# Patient Record
Sex: Male | Born: 1975 | Race: White | Hispanic: No | Marital: Single | Smoking: Current every day smoker
Health system: Southern US, Community
[De-identification: ages and names within clinical notes are randomized; demographics above are authoritative.]

## PROBLEM LIST (undated history)

## (undated) DIAGNOSIS — F32A Depression, unspecified: Secondary | ICD-10-CM

## (undated) DIAGNOSIS — F419 Anxiety disorder, unspecified: Secondary | ICD-10-CM

## (undated) DIAGNOSIS — I10 Essential (primary) hypertension: Secondary | ICD-10-CM

## (undated) DIAGNOSIS — F329 Major depressive disorder, single episode, unspecified: Secondary | ICD-10-CM

---

## 2016-02-13 ENCOUNTER — Emergency Department (HOSPITAL_COMMUNITY)
Admission: EM | Admit: 2016-02-13 | Discharge: 2016-02-13 | Disposition: A | Discharge status: Home / Self Care | Payer: Self-pay | Attending: Emergency Medicine | Admitting: Emergency Medicine

## 2016-02-13 ENCOUNTER — Encounter (HOSPITAL_COMMUNITY): Payer: Self-pay | Admitting: *Deleted

## 2016-02-13 DIAGNOSIS — F17210 Nicotine dependence, cigarettes, uncomplicated: Secondary | ICD-10-CM | POA: Insufficient documentation

## 2016-02-13 DIAGNOSIS — N50819 Testicular pain, unspecified: Secondary | ICD-10-CM | POA: Insufficient documentation

## 2016-02-13 NOTE — ED Notes (Signed)
Pt states that when he woke up Sat morning and went to the bathroom he noted that he had soreness to his scrotum and the area right above his penis; pt states that the pain has progressively gotten worse throughout the day; pt states he feels that the area is swollen; pt states that his scrotum is sore to the touch; pt denies injury; pt denies pain to penis

## 2016-02-13 NOTE — ED Notes (Signed)
No answer when pt's name called in the waiting room; pt eloped from the lobby

## 2016-02-13 NOTE — ED Notes (Signed)
No answer when pt's name called in the lobby 

## 2016-02-13 NOTE — ED Notes (Signed)
No answer when pt's name called in the waiting room 

## 2017-06-09 ENCOUNTER — Inpatient Hospital Stay (HOSPITAL_COMMUNITY)
Admission: EM | Admit: 2017-06-09 | Discharge: 2017-06-11 | DRG: 684 | Disposition: A | Discharge status: Home / Self Care | Payer: Self-pay | Attending: Internal Medicine | Admitting: Internal Medicine

## 2017-06-09 ENCOUNTER — Encounter (HOSPITAL_COMMUNITY): Payer: Self-pay | Admitting: Emergency Medicine

## 2017-06-09 DIAGNOSIS — R42 Dizziness and giddiness: Secondary | ICD-10-CM

## 2017-06-09 DIAGNOSIS — E875 Hyperkalemia: Secondary | ICD-10-CM | POA: Diagnosis present

## 2017-06-09 DIAGNOSIS — F419 Anxiety disorder, unspecified: Secondary | ICD-10-CM

## 2017-06-09 DIAGNOSIS — G4700 Insomnia, unspecified: Secondary | ICD-10-CM | POA: Diagnosis present

## 2017-06-09 DIAGNOSIS — F329 Major depressive disorder, single episode, unspecified: Secondary | ICD-10-CM | POA: Diagnosis present

## 2017-06-09 DIAGNOSIS — I10 Essential (primary) hypertension: Secondary | ICD-10-CM

## 2017-06-09 DIAGNOSIS — E860 Dehydration: Secondary | ICD-10-CM | POA: Diagnosis present

## 2017-06-09 DIAGNOSIS — Z8249 Family history of ischemic heart disease and other diseases of the circulatory system: Secondary | ICD-10-CM

## 2017-06-09 DIAGNOSIS — Z791 Long term (current) use of non-steroidal anti-inflammatories (NSAID): Secondary | ICD-10-CM

## 2017-06-09 DIAGNOSIS — F17210 Nicotine dependence, cigarettes, uncomplicated: Secondary | ICD-10-CM | POA: Diagnosis present

## 2017-06-09 DIAGNOSIS — F32A Depression, unspecified: Secondary | ICD-10-CM

## 2017-06-09 DIAGNOSIS — I959 Hypotension, unspecified: Secondary | ICD-10-CM | POA: Diagnosis present

## 2017-06-09 DIAGNOSIS — N179 Acute kidney failure, unspecified: Principal | ICD-10-CM | POA: Diagnosis present

## 2017-06-09 LAB — CBC
HCT: 45.7 % (ref 39.0–52.0)
HEMOGLOBIN: 16.2 g/dL (ref 13.0–17.0)
MCH: 32 pg (ref 26.0–34.0)
MCHC: 35.4 g/dL (ref 30.0–36.0)
MCV: 90.3 fL (ref 78.0–100.0)
Platelets: 326 10*3/uL (ref 150–400)
RBC: 5.06 MIL/uL (ref 4.22–5.81)
RDW: 13.5 % (ref 11.5–15.5)
WBC: 16.8 10*3/uL — ABNORMAL HIGH (ref 4.0–10.5)

## 2017-06-09 LAB — COMPREHENSIVE METABOLIC PANEL
ALT: 19 U/L (ref 17–63)
ANION GAP: 11 (ref 5–15)
AST: 17 U/L (ref 15–41)
Albumin: 4.8 g/dL (ref 3.5–5.0)
Alkaline Phosphatase: 71 U/L (ref 38–126)
BUN: 35 mg/dL — ABNORMAL HIGH (ref 6–20)
CHLORIDE: 101 mmol/L (ref 101–111)
CO2: 25 mmol/L (ref 22–32)
CREATININE: 3.38 mg/dL — AB (ref 0.61–1.24)
Calcium: 9.7 mg/dL (ref 8.9–10.3)
GFR calc non Af Amer: 21 mL/min — ABNORMAL LOW (ref >60)
GFR, EST AFRICAN AMERICAN: 24 mL/min — AB (ref >60)
Glucose, Bld: 112 mg/dL — ABNORMAL HIGH (ref 65–99)
POTASSIUM: 4.5 mmol/L (ref 3.5–5.1)
SODIUM: 137 mmol/L (ref 135–145)
Total Bilirubin: 1.1 mg/dL (ref 0.3–1.2)
Total Protein: 8.3 g/dL — ABNORMAL HIGH (ref 6.5–8.1)

## 2017-06-09 LAB — I-STAT TROPONIN, ED: TROPONIN I, POC: 0 ng/mL (ref 0.00–0.08)

## 2017-06-09 LAB — URINALYSIS, ROUTINE W REFLEX MICROSCOPIC
GLUCOSE, UA: NEGATIVE mg/dL
Hgb urine dipstick: NEGATIVE
KETONES UR: 5 mg/dL — AB
NITRITE: NEGATIVE
Specific Gravity, Urine: 1.02 (ref 1.005–1.030)
pH: 5 (ref 5.0–8.0)

## 2017-06-09 LAB — RAPID URINE DRUG SCREEN, HOSP PERFORMED
Amphetamines: POSITIVE — AB
BENZODIAZEPINES: POSITIVE — AB
Barbiturates: NOT DETECTED
Cocaine: NOT DETECTED
Opiates: NOT DETECTED
Tetrahydrocannabinol: POSITIVE — AB

## 2017-06-09 LAB — I-STAT CG4 LACTIC ACID, ED: LACTIC ACID, VENOUS: 0.92 mmol/L (ref 0.5–1.9)

## 2017-06-09 LAB — TROPONIN I: Troponin I: <0.03 ng/mL (ref <0.03)

## 2017-06-09 LAB — ETHANOL

## 2017-06-09 MED ORDER — ACETAMINOPHEN 650 MG RE SUPP: 650.0000 mg | Freq: Four times a day (QID) | RECTAL | Status: DC | PRN

## 2017-06-09 MED ORDER — ONDANSETRON HCL 4 MG PO TABS: 4.0000 mg | ORAL_TABLET | Freq: Four times a day (QID) | ORAL | Status: DC | PRN

## 2017-06-09 MED ORDER — ALPRAZOLAM 0.5 MG PO TABS
0.5000 mg | ORAL_TABLET | Freq: Every evening | ORAL | Status: DC | PRN
Start: 2017-06-09 — End: 2017-06-11
  Administered 2017-06-09 – 2017-06-10 (×2): 0.5 mg via ORAL
  Filled 2017-06-09 (×2): qty 1

## 2017-06-09 MED ORDER — HEPARIN SODIUM (PORCINE) 5000 UNIT/ML IJ SOLN
5000.0000 [IU] | Freq: Three times a day (TID) | INTRAMUSCULAR | Status: DC
  Administered 2017-06-09 – 2017-06-11 (×5): 5000 [IU] via SUBCUTANEOUS
  Filled 2017-06-09 (×5): qty 1

## 2017-06-09 MED ORDER — SODIUM CHLORIDE 0.9 % IV SOLN: 1000.0000 mL | INTRAVENOUS | Status: DC

## 2017-06-09 MED ORDER — TRAMADOL HCL 50 MG PO TABS
50.0000 mg | ORAL_TABLET | Freq: Two times a day (BID) | ORAL | Status: DC | PRN
  Administered 2017-06-09: 50 mg via ORAL
  Filled 2017-06-09: qty 1

## 2017-06-09 MED ORDER — ENSURE ENLIVE PO LIQD
237.0000 mL | Freq: Two times a day (BID) | ORAL | Status: DC
  Administered 2017-06-10 – 2017-06-11 (×3): 237 mL via ORAL

## 2017-06-09 MED ORDER — SODIUM CHLORIDE 0.9 % IV SOLN
INTRAVENOUS | Status: DC
  Administered 2017-06-09 – 2017-06-11 (×3): via INTRAVENOUS

## 2017-06-09 MED ORDER — SODIUM CHLORIDE 0.9 % IV BOLUS (SEPSIS)
2000.0000 mL | Freq: Once | INTRAVENOUS | Status: AC
  Administered 2017-06-09: 2000 mL via INTRAVENOUS

## 2017-06-09 MED ORDER — ZOLPIDEM TARTRATE 5 MG PO TABS
5.0000 mg | ORAL_TABLET | Freq: Every evening | ORAL | Status: DC | PRN
  Administered 2017-06-10: 5 mg via ORAL
  Filled 2017-06-09: qty 1

## 2017-06-09 MED ORDER — ONDANSETRON HCL 4 MG/2ML IJ SOLN: 4.0000 mg | Freq: Four times a day (QID) | INTRAMUSCULAR | Status: DC | PRN

## 2017-06-09 MED ORDER — SODIUM CHLORIDE 0.9 % IV BOLUS (SEPSIS): 1000.0000 mL | Freq: Once | INTRAVENOUS | Status: DC

## 2017-06-09 MED ORDER — ACETAMINOPHEN 325 MG PO TABS
650.0000 mg | ORAL_TABLET | Freq: Four times a day (QID) | ORAL | Status: DC | PRN
  Administered 2017-06-10: 650 mg via ORAL
  Filled 2017-06-09: qty 2

## 2017-06-09 NOTE — ED Notes (Signed)
Call report to Dois DavenportSandra at [phone] at [facility]

## 2017-06-09 NOTE — H&P (Addendum)
TRH H&P    Patient Demographics:    Luis CossRobert Raffety, is a 41 y.o. male  MRN: [id number]  DOB - 02/14/1976  Admit Date - 06/09/2017  Referring MD/NP/PA: Arlana HoveSophiya  Outpatient Primary MD for the patient is Dartha LodgeSteele, Anthony, FNP  Patient coming from: Home  Chief Complaint  Patient presents with  . Near Syncope  . Dizziness      HPI:    Luis Cortez  is a 41 y.o. male, with history of hypertension, anxiety came to hospital with complaints of dizziness. Patient says that symptoms started 10 days ago when he received bad news that he will need to go to jail for 14 years. Since then patient says that he has not been able to sleep well, has not been eating much food. He has been taking his medications for hypertension including lisinopril. 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 Patient says that every time he got up from sitting position he felt dizzy but he denies passing out. He denies chest pain or shortness of breath. No nausea vomiting or diarrhea. Patient also was prescribed Prozac which made his anxiety worse. He stopped taking Prozac a week ago.  He denies fever or dysuria. No abdominal pain.  In the ED, lab work showed creatinine 3.38. With BUN 35. Unknown baseline.    Review of systems:     All other systems reviewed and are negative.   With Past History of the following :    Hypertension   Social History:      Social History  Substance Use Topics  . Smoking status: Current Every Day Smoker    Packs/day: 1.00    Types: Cigarettes  . Smokeless tobacco: Never Used  . Alcohol use Yes     Comment: socially       Family History :   Positive family history of hypertension, heart disease patient's both grandparents from both mother and father's side had heart problems, hypertension   Home Medications:   Prior to Admission medications   Medication Sig Start  Date End Date Taking? Authorizing Provider  FLUoxetine (PROZAC) 40 MG capsule Take 40 mg by mouth daily.   Yes [provider]  hydrOXYzine (ATARAX/VISTARIL) 25 MG tablet Take 25 mg by mouth 3 (three) times daily as needed. Take 1 capsule in the morning and 2 in the evening   Yes [provider]  lamoTRIgine (LAMICTAL) 100 MG tablet Take 100 mg by mouth 2 (two) times daily.   Yes [provider]  lisinopril (PRINIVIL,ZESTRIL) 20 MG tablet Take 20 mg by mouth daily.   Yes [provider]  meloxicam (MOBIC) 15 MG tablet Take 15 mg by mouth daily.   Yes [provider]     Allergies:    No Known Allergies   Physical Exam:   Vitals  Blood pressure 98/61, pulse 97, temperature 98 F (36.7 C), temperature source Oral, resp. rate 18, SpO2 100 %.  1.  General: Appears anxious  2. Psychiatric:  Intact  judgement and  insight, awake alert, oriented x 3.  3. Neurologic: No focal neurological deficits, all cranial nerves intact.Strength 5/5 all 4 extremities, sensation intact all 4 extremities, plantars down going.  4. Eyes :  anicteric sclerae, moist conjunctivae with no lid lag. PERRLA.  5. ENMT:  Oropharynx clear with moist mucous membranes and good dentition  6. Neck:  supple, no cervical lymphadenopathy appriciated, No thyromegaly  7. Respiratory : Normal respiratory effort, good air movement bilaterally,clear to  auscultation bilaterally  8. Cardiovascular : RRR, no gallops, rubs or murmurs, no leg edema  9. Gastrointestinal:  Positive bowel sounds, abdomen soft, non-tender to palpation,no hepatosplenomegaly, no rigidity or guarding       10. Skin:  No cyanosis, normal texture and turgor, no rash, lesions or ulcers  11.Musculoskeletal:  Good muscle tone,  joints appear normal , no effusions,  normal range of motion    Data Review:    CBC  Recent Labs Lab 06/09/17 1554  WBC 16.8*  HGB 16.2  HCT 45.7  PLT 326  MCV  90.3  MCH 32.0  MCHC 35.4  RDW 13.5   ------------------------------------------------------------------------------------------------------------------  Chemistries   Recent Labs Lab 06/09/17 1554  NA 137  K 4.5  CL 101  CO2 25  GLUCOSE 112*  BUN 35*  CREATININE 3.38*  CALCIUM 9.7  AST 17  ALT 19  ALKPHOS 71  BILITOT 1.1   ------------------------------------------------------------------------------------------------------------------  ------------------------------------------------------------------------------------------------------------------ GFR: CrCl cannot be calculated (Unknown ideal weight.). Liver Function Tests:  Recent Labs Lab 06/09/17 1554  AST 17  ALT 19  ALKPHOS 71  BILITOT 1.1  PROT 8.3*  ALBUMIN 4.8    --------------------------------------------------------------------------------------------------------------- Urine analysis:    Component Value Date/Time   COLORURINE AMBER (A) 06/09/2017 1515   APPEARANCEUR CLOUDY (A) 06/09/2017 1515   LABSPEC 1.020 06/09/2017 1515   PHURINE 5.0 06/09/2017 1515   GLUCOSEU NEGATIVE 06/09/2017 1515   HGBUR NEGATIVE 06/09/2017 1515   BILIRUBINUR SMALL (A) 06/09/2017 1515   KETONESUR 5 (A) 06/09/2017 1515   PROTEINUR >=300 (A) 06/09/2017 1515   NITRITE NEGATIVE 06/09/2017 1515   LEUKOCYTESUR SMALL (A) 06/09/2017 1515      Imaging Results:    No results found.  EKG- nonspecific ST changes   Assessment & Plan:    Active Problems:   AKI (acute kidney injury) (HCC)   1. Acute kidney injury- unknown baseline, creatinine is 3.38 will check urine creatinine, urine sodium, renal ultrasound. Started on IV normal saline at 125 ML per hour. Follow BMP in a.m. 2. Dizziness/presyncope-likely due to above. Started on IV normal saline. Follow BMP in a.m. Will obtain Echocardiogram. 3. Hypertension- blood pressure is soft, will hold lisinopril due to above. Will monitor. 4. Anxiety-patient has been  having anxiety and insomnia for past 1 week, start Xanax 0.5 mg daily at bedtime when necessary. Will hold Prozac as patient has not continued this medication as outpatient. Hold Lamictal. 5. Polysubstance abuse- urine drug screen is positive for benzodiazepine, amphetamines, tetrahydrocannabinol. Will get social work consult. 6. Abnormal EKG-EKG shows nonspecific ST changes, also patient came with presyncope. Will obtain echocardiogram in a.m., cycle cardiac enzymes every 6 hours 3.   DVT Prophylaxis-   Heparin   AM Labs Ordered, also please review Full Orders  Family Communication: Admission, patients condition and plan of care including tests being ordered have been discussed with the patient and *his wife at bedside who indicate understanding and agree with the plan and Code Status.  Code Status:  Full code  Admission status: Observation    Time spent in minutes : 50 minutes   Fermon Ureta S M.D on 06/09/2017 at 5:29 PM  Between 7am to 7pm - Pager - [phone]. After 7pm go to [email or web address] - password [facility]  [facility] - Office  [phone]

## 2017-06-09 NOTE — ED Provider Notes (Signed)
[facility]-EMERGENCY DEPT Provider Note   CSN: [id number] Arrival date & time: 06/09/17  1335     History   Chief Complaint Chief Complaint  Patient presents with  . Near Syncope  . Dizziness    HPI Luis Cortez is a 41 y.o. male presenting with near syncope.  Patient states that 10 days ago, he received bad news that he would need to show up for court and go to jail for 14 years. Since then, he's been unable to eat, drink, or sleep. He works as a Scientist, water quality outside. Yesterday, he started to have orthostatic symptoms including dizziness when he went from sitting to his standing. This morning, his symptoms were more pronounced. He still tried to go to work, but was unable to tolerate work due to his symptoms. He reports that he is asymptomatic when he is lying down and at rest, but when he becomes anxious or tries to stand up, he gets lightheaded and very shaky. He has associated headache of bilateral temples.  He took his normal medicines this morning, including lisinopril. He is also on Atarax which he takes every day. He is prescribed Prozac, but has not taken it for the past 4 days, as he states it made him feel worse. Patient smokes half a pack a day, drinks 3-4 beers 3 times a week, and states that he did just a little bit of meth 2 days ago. He denies fever, chills, cough, sore throat, chest pain, shortness of breath, nausea, vomiting, abdominal pain, or abnormal bowel movements. He reports decreased urination. He denies vision changes, decreased concentration, slurred speech, numbness, tingling, or weakness.   HPI  History reviewed. No pertinent past medical history.  There are no active problems to display for this patient.   History reviewed. No pertinent surgical history.     Home Medications    Prior to Admission medications   Medication Sig Start Date End Date Taking? Authorizing Provider  FLUoxetine (PROZAC) 40 MG capsule Take 40 mg by mouth daily.   Yes [provider]  hydrOXYzine (ATARAX/VISTARIL) 25 MG tablet Take 25 mg by mouth 3 (three) times daily as needed. Take 1 capsule in the morning and 2 in the evening   Yes [provider]  lamoTRIgine (LAMICTAL) 100 MG tablet Take 100 mg by mouth 2 (two) times daily.   Yes [provider]  lisinopril (PRINIVIL,ZESTRIL) 20 MG tablet Take 20 mg by mouth daily.   Yes [provider]  meloxicam (MOBIC) 15 MG tablet Take 15 mg by mouth daily.   Yes [provider]    Family History No family history on file.  Social History Social History  Substance Use Topics  . Smoking status: Current Every Day Smoker    Packs/day: 1.00    Types: Cigarettes  . Smokeless tobacco: Never Used  . Alcohol use Yes     Comment: socially     Allergies   Patient has no known allergies.   Review of Systems Review of Systems  Constitutional: Negative for chills and fever.  HENT: Negative for congestion and sore throat.   Eyes: Negative for photophobia and visual disturbance.  Respiratory: Negative for cough, chest tightness and shortness of breath.   Cardiovascular: Negative for chest pain, palpitations and leg swelling.  Gastrointestinal: Negative for abdominal pain, constipation, diarrhea, nausea and vomiting.  Genitourinary: Positive for decreased urine volume. Negative for dysuria and hematuria.  Musculoskeletal: Negative for back pain and neck pain.  Skin: Negative for  wound.  Neurological: Positive for dizziness, light-headedness and headaches. Negative for weakness.  Hematological: Does not bruise/bleed easily.  Psychiatric/Behavioral: Negative for confusion. The patient is nervous/anxious.      Physical Exam Updated Vital Signs BP 98/61 (BP Location: Left Arm)   Pulse 97   Temp 98 F (36.7 C) (Oral)   Resp 18   SpO2 100%   Physical Exam  Constitutional: He is oriented to person, place, and time. He appears well-developed and well-nourished. No distress.    HENT:  Head: Normocephalic and atraumatic.  Mouth/Throat: Uvula is midline. Mucous membranes are dry.  Eyes: Pupils are equal, round, and reactive to light. EOM are normal.  Neck: Normal range of motion. Neck supple.  Cardiovascular: Regular rhythm and intact distal pulses.  Tachycardia present.   Pulmonary/Chest: Effort normal and breath sounds normal. No respiratory distress. He has no wheezes. He exhibits no tenderness.  Abdominal: Soft. Bowel sounds are normal. He exhibits no distension and no mass. There is no tenderness. There is no rebound and no guarding.  Musculoskeletal: Normal range of motion.  Strength intact 4. Sensation intact 4. Radial and pedal pulses equal bilaterally. Color and warmth equal bilaterally.  Neurological: He is alert and oriented to person, place, and time. He has normal strength. No cranial nerve deficit or sensory deficit. He displays a negative Romberg sign. Coordination normal. GCS eye subscore is 4. GCS verbal subscore is 5. GCS motor subscore is 6.  Skin: Skin is warm and dry.  Psychiatric: He has a normal mood and affect.  Nursing note and vitals reviewed.    ED Treatments / Results  Labs (all labs ordered are listed, but only abnormal results are displayed) Labs Reviewed  CBC - Abnormal; Notable for the following:       Result Value   WBC 16.8 (*)    All other components within normal limits  COMPREHENSIVE METABOLIC PANEL - Abnormal; Notable for the following:    Glucose, Bld 112 (*)    BUN 35 (*)    Creatinine, Ser 3.38 (*)    Total Protein 8.3 (*)    GFR calc non Af Amer 21 (*)    GFR calc Af Amer 24 (*)    All other components within normal limits  RAPID URINE DRUG SCREEN, HOSP PERFORMED - Abnormal; Notable for the following:    Benzodiazepines POSITIVE (*)    Amphetamines POSITIVE (*)    Tetrahydrocannabinol POSITIVE (*)    All other components within normal limits  ETHANOL  URINALYSIS, ROUTINE W REFLEX MICROSCOPIC  I-STAT  TROPONIN, ED  I-STAT CG4 LACTIC ACID, ED    EKG  EKG Interpretation  Date/Time:  Saturday June 09 2017 13:58:52 EDT Ventricular Rate:  104 PR Interval:    QRS Duration: 96 QT Interval:  354 QTC Calculation: 466 R Axis:   94 Text Interpretation:  Sinus tachycardia Consider right atrial enlargement Borderline right axis deviation Probable left ventricular hypertrophy ST elevation suggests acute pericarditis No previous ECGs available Confirmed by Richardean Canal [phone]) on 06/09/2017 3:58:07 PM       Radiology No results found.  Procedures Procedures (including critical care time)  Medications Ordered in ED Medications  sodium chloride 0.9 % bolus 2,000 mL (2,000 mLs Intravenous New Bag/Given 06/09/17 1600)     Initial Impression / Assessment and Plan / ED Course  I have reviewed the triage vital signs and the nursing notes.  Pertinent labs & imaging results that were available during my care of the  patient were reviewed by me and considered in my medical decision making (see chart for details).     Patient presenting with hypotension, dizziness, and lightheadedness. Symptoms are present when he stands up. Vital signs show patient is hypotensive and tachycardic, positive orthostatic vital signs. Physical exam otherwise reassuring, no neurologic deficits. Will order basic labs, UA, UDS, EKG, troponin, and ethanol.  CMP shows elevated creatinine at 3.38. No baseline to compare. UDS positive for benzos, amphetamines, and marijuana. UA shows proteinuria, small leuks and many bacteria. Will send for culture. Discussed case with attending, and Dr. Silverio LayYao evaluated the pt.  Hypotension is likely multifactorial, including dehydration, lisinopril use, and drug use.  Will consult with hospitalist for admission. Pt to be admitted.  Final Clinical Impressions(s) / ED Diagnoses   Final diagnoses:  Hypotension, unspecified hypotension type  AKI (acute kidney injury) [facility](HCC)    New  Prescriptions New Prescriptions   No medications on file     Alveria ApleyCaccavale, Donnica Jarnagin, PA-C 06/09/17 2341    Charlynne PanderYao, David Hsienta, MD 06/10/17 [phone]

## 2017-06-09 NOTE — ED Notes (Signed)
Bed: WA20 Expected date:  Expected time:  Means of arrival:  Comments: Orthostatic

## 2017-06-09 NOTE — ED Triage Notes (Signed)
Patient here from home with complaints of dizziness when standing. Near syncopal episode today. 400 ml NS given.

## 2017-06-10 ENCOUNTER — Observation Stay (HOSPITAL_BASED_OUTPATIENT_CLINIC_OR_DEPARTMENT_OTHER): Payer: Self-pay

## 2017-06-10 ENCOUNTER — Observation Stay (HOSPITAL_COMMUNITY): Payer: Self-pay

## 2017-06-10 DIAGNOSIS — R55 Syncope and collapse: Secondary | ICD-10-CM

## 2017-06-10 LAB — BASIC METABOLIC PANEL
ANION GAP: 7 (ref 5–15)
BUN: 21 mg/dL — ABNORMAL HIGH (ref 6–20)
CO2: 29 mmol/L (ref 22–32)
Calcium: 9.2 mg/dL (ref 8.9–10.3)
Chloride: 108 mmol/L (ref 101–111)
Creatinine, Ser: 1.1 mg/dL (ref 0.61–1.24)
Glucose, Bld: 91 mg/dL (ref 65–99)
POTASSIUM: 4.7 mmol/L (ref 3.5–5.1)
SODIUM: 144 mmol/L (ref 135–145)

## 2017-06-10 LAB — ECHOCARDIOGRAM COMPLETE
Height: 67 in
Weight: 1862.45 oz

## 2017-06-10 LAB — TROPONIN I: Troponin I: <0.03 ng/mL (ref <0.03)

## 2017-06-10 LAB — COMPREHENSIVE METABOLIC PANEL
ALT: 16 U/L — AB (ref 17–63)
AST: 18 U/L (ref 15–41)
Albumin: 3.9 g/dL (ref 3.5–5.0)
Alkaline Phosphatase: 58 U/L (ref 38–126)
Anion gap: 5 (ref 5–15)
BUN: 28 mg/dL — AB (ref 6–20)
CHLORIDE: 107 mmol/L (ref 101–111)
CO2: 30 mmol/L (ref 22–32)
CREATININE: 1.71 mg/dL — AB (ref 0.61–1.24)
Calcium: 8.9 mg/dL (ref 8.9–10.3)
GFR calc Af Amer: 56 mL/min — ABNORMAL LOW (ref >60)
GFR, EST NON AFRICAN AMERICAN: 48 mL/min — AB (ref >60)
Glucose, Bld: 75 mg/dL (ref 65–99)
Potassium: 5.2 mmol/L — ABNORMAL HIGH (ref 3.5–5.1)
Sodium: 142 mmol/L (ref 135–145)
Total Bilirubin: 0.9 mg/dL (ref 0.3–1.2)
Total Protein: 6.5 g/dL (ref 6.5–8.1)

## 2017-06-10 LAB — CBC
HCT: 37.5 % — ABNORMAL LOW (ref 39.0–52.0)
Hemoglobin: 12.9 g/dL — ABNORMAL LOW (ref 13.0–17.0)
MCH: 31.4 pg (ref 26.0–34.0)
MCHC: 34.4 g/dL (ref 30.0–36.0)
MCV: 91.2 fL (ref 78.0–100.0)
PLATELETS: 274 10*3/uL (ref 150–400)
RBC: 4.11 MIL/uL — ABNORMAL LOW (ref 4.22–5.81)
RDW: 13.4 % (ref 11.5–15.5)
WBC: 9.8 10*3/uL (ref 4.0–10.5)

## 2017-06-10 LAB — CREATININE, URINE, RANDOM: Creatinine, Urine: 138.9 mg/dL

## 2017-06-10 LAB — SODIUM, URINE, RANDOM: Sodium, Ur: 101 mmol/L

## 2017-06-10 LAB — HIV ANTIBODY (ROUTINE TESTING W REFLEX): HIV SCREEN 4TH GENERATION: NONREACTIVE

## 2017-06-10 MED ORDER — TRAMADOL HCL 50 MG PO TABS
50.0000 mg | ORAL_TABLET | Freq: Four times a day (QID) | ORAL | Status: DC | PRN
  Administered 2017-06-10 – 2017-06-11 (×4): 100 mg via ORAL
  Filled 2017-06-10 (×4): qty 2

## 2017-06-10 NOTE — Progress Notes (Signed)
  Echocardiogram 2D Echocardiogram has been performed.  Saleena Tamas L Androw 06/10/2017, 9:47 AM

## 2017-06-10 NOTE — Progress Notes (Signed)
[facility]                                                                              Patient Demographics  Luis Cortez, is a 41 y.o. male, DOB - 05/28/1976, ZOX:[id number]  Admit date - 06/09/2017   Admitting Physician Meredeth Ide, MD  Outpatient Primary MD for the patient is Dartha Lodge, FNP  Outpatient specialists:   LOS - 0  days    Chief Complaint  Patient presents with  . Near Syncope  . Dizziness       Brief summary  Luis Cortez  is a 41 y.o. male, with history significant for but not limited to hypertension presenting with dizziness associated with poor oral intake after hearing bad news for going to jail.  Ed labs indicated elevated Cr of 3.38 with BUN of 35, and is admitted for AKI likley due to volume depletion   Assessment & Plan    Active Problems:   AKI (acute kidney injury) (HCC)  AKI: renal fxn improving with hydration/supportive care Renal US 06/10/17- Possible cortical calcification or nonobstructive calculus seen in upper pole of right kidney, otherwise negative  Dizziness: Resolved Poss presyncope- echo 06/10/17 - pending Resolved- likely related to AKI  Hypertension: Lisinopril on hold due to AKI- may resume on d/c as needed  Hyperkalemia: Mild, expect to resolve with improvement of renal function  Code Status: Full code DVT Prophylaxis: heparin  Family Communication: Discussed in detail with the patient, all imaging results, lab results explained to the patient   Disposition Plan: Home  Time Spent in minutes   35 minutes  Procedures:    Consultants:     Antimicrobials:      Medications  Scheduled Meds: . feeding supplement (ENSURE ENLIVE)  237 mL Oral BID BM  . heparin  5,000 Units Subcutaneous Q8H   Continuous Infusions: . sodium chloride 125 mL/hr at 06/09/17 2026   PRN Meds:.acetaminophen **OR** acetaminophen, ALPRAZolam, ondansetron **OR** ondansetron (ZOFRAN) IV, traMADol,  zolpidem   Antibiotics   Anti-infectives    None        Subjective:   Luis Cortez was seen and examined today. Patient denies dizziness, chest pain, shortness of breath, abdominal pain, N/V/D/C, new weakness, numbess, tingling. No acute events overnight.    Objective:   Vitals:   06/09/17 1817 06/09/17 1850 06/10/17 0442 06/10/17 0500  BP: 96/70 104/67 (!) 145/94   Pulse: 81 94 (!) 114   Resp: Temp:  98.1 F (36.7 C) 98.1 F (36.7 C)   TempSrc:  Oral Oral   SpO2: 99% 100% (!) 80% 100%  Weight:  52.8 kg (116 lb 6.5 oz)    Height:   (1.702 m)      Intake/Output Summary (Last 24 hours) at 06/10/17 1142 Last data filed at 06/10/17 0443  Gross per 24 hour  Intake           815.83 ml  Output              900 ml  Net           -84.17 ml  Wt Readings from Last 3 Encounters:  06/09/17 52.8 kg (116 lb 6.5 oz)     Exam  General: NAD  HEENT: NCAT,  PERRL,MMM  Neck: SUPPLE, (-) JVD  Cardiovascular: RRR, (-) GALLOP, (-) MURMUR  Respiratory: CTA  Gastrointestinal: SOFT, (-) DISTENSION, BS(+), (_) TENDERNESS  Ext: (-) CYANOSIS, (-) EDEMA  Neuro: A, OX 3  Skin:(-) RASH  Psych:NORMAL AFFECT/MOOD   Data Reviewed:  I have personally reviewed following labs and imaging studies  Micro Results No results found for this or any previous visit (from the past 240 hour(s)).  Radiology Reports [facility]  Result Date: 06/10/2017 CLINICAL DATA:  Acute kidney injury. EXAM: RENAL / URINARY TRACT ULTRASOUND COMPLETE COMPARISON:  None. FINDINGS: Right Kidney: Length: 9.9 cm. 4 mm calcification is noted in upper pole concerning for cortical calcification or nonobstructive nephrolithiasis. Echogenicity within normal limits. No mass or hydronephrosis visualized. Left Kidney: Length: 10.2 cm. Echogenicity within normal limits. No mass or hydronephrosis visualized. Bladder: Appears normal for degree of bladder distention. IMPRESSION: Possible cortical  calcification or nonobstructive calculus seen in upper pole of right kidney. No other renal abnormality seen. Electronically Signed   By: Lupita RaiderJames  Green Jr, M.D.   On: 06/10/2017 09:09    Lab Data:  CBC:  Recent Labs Lab 06/09/17 1554 06/10/17 0137  WBC 16.8* 9.8  HGB 16.2 12.9*  HCT 45.7 37.5*  MCV 90.3 91.2  PLT 326 274   Basic Metabolic Panel:  Recent Labs Lab 06/09/17 1554 06/10/17 0137  NA 137 142  K 4.5 5.2*  CL 101 107  CO2 25 30  GLUCOSE 112* 75  BUN 35* 28*  CREATININE 3.38* 1.71*  CALCIUM 9.7 8.9   GFR: Estimated Creatinine Clearance: 42.5 mL/min (A) (by C-G formula based on SCr of 1.71 mg/dL (H)). Liver Function Tests:  Recent Labs Lab 06/09/17 1554 06/10/17 0137  AST 17 18  ALT 19 16*  ALKPHOS 71 58  BILITOT 1.1 0.9  PROT 8.3* 6.5  ALBUMIN 4.8 3.9   No results for input(s): LIPASE, AMYLASE in the last 168 hours. No results for input(s): AMMONIA in the last 168 hours. Coagulation Profile: No results for input(s): INR, PROTIME in the last 168 hours. Cardiac Enzymes:  Recent Labs Lab 06/09/17 1957 06/10/17 0137 06/10/17 0709  TROPONINI <0.03 <0.03 <0.03   BNP (last 3 results) No results for input(s): PROBNP in the last 8760 hours. HbA1C: No results for input(s): HGBA1C in the last 72 hours. CBG: No results for input(s): GLUCAP in the last 168 hours. Lipid Profile: No results for input(s): CHOL, HDL, LDLCALC, TRIG, CHOLHDL, LDLDIRECT in the last 72 hours. Thyroid Function Tests: No results for input(s): TSH, T4TOTAL, FREET4, T3FREE, THYROIDAB in the last 72 hours. Anemia Panel: No results for input(s): VITAMINB12, FOLATE, FERRITIN, TIBC, IRON, RETICCTPCT in the last 72 hours. Urine analysis:    Component Value Date/Time   COLORURINE AMBER (A) 06/09/2017 1515   APPEARANCEUR CLOUDY (A) 06/09/2017 1515   LABSPEC 1.020 06/09/2017 1515   PHURINE 5.0 06/09/2017 1515   GLUCOSEU NEGATIVE 06/09/2017 1515   HGBUR NEGATIVE 06/09/2017 1515    BILIRUBINUR SMALL (A) 06/09/2017 1515   KETONESUR 5 (A) 06/09/2017 1515   PROTEINUR >=300 (A) 06/09/2017 1515   NITRITE NEGATIVE 06/09/2017 1515   LEUKOCYTESUR SMALL (A) 06/09/2017 1515     OSEI-BONSU,Selig Wampole M.D. [facility] 06/10/2017, 11:42 AM  Pager: [phone] Between 7am to 7pm - call Pager - [phone]  After 7pm go to [email or web address] - password [email or web address]  Call night coverage person covering after 7pm

## 2017-06-11 DIAGNOSIS — I10 Essential (primary) hypertension: Secondary | ICD-10-CM

## 2017-06-11 DIAGNOSIS — F32A Depression, unspecified: Secondary | ICD-10-CM

## 2017-06-11 DIAGNOSIS — R42 Dizziness and giddiness: Secondary | ICD-10-CM

## 2017-06-11 DIAGNOSIS — N179 Acute kidney failure, unspecified: Principal | ICD-10-CM

## 2017-06-11 DIAGNOSIS — F419 Anxiety disorder, unspecified: Secondary | ICD-10-CM

## 2017-06-11 DIAGNOSIS — F329 Major depressive disorder, single episode, unspecified: Secondary | ICD-10-CM

## 2017-06-11 LAB — BASIC METABOLIC PANEL
Anion gap: 7 (ref 5–15)
BUN: 19 mg/dL (ref 6–20)
CALCIUM: 8.9 mg/dL (ref 8.9–10.3)
CO2: 28 mmol/L (ref 22–32)
Chloride: 106 mmol/L (ref 101–111)
Creatinine, Ser: 1.03 mg/dL (ref 0.61–1.24)
Glucose, Bld: 90 mg/dL (ref 65–99)
POTASSIUM: 4.1 mmol/L (ref 3.5–5.1)
SODIUM: 141 mmol/L (ref 135–145)

## 2017-06-11 LAB — URINE CULTURE

## 2017-06-11 MED ORDER — ADULT MULTIVITAMIN W/MINERALS CH: 1.0000 | ORAL_TABLET | Freq: Every day | ORAL | Status: DC

## 2017-06-11 NOTE — Progress Notes (Signed)
Initial Nutrition Assessment  DOCUMENTATION CODES:   Underweight  INTERVENTION:  - Continue Ensure Enlive BID, each supplement provides 350 kcal and 20 grams of protein - Will order daily multivitamin with minerals.  - Continue to encourage PO intakes of meals and supplements. - RD will assess for malnutrition at follow-up.  NUTRITION DIAGNOSIS:   Inadequate oral intake related to poor appetite, social / environmental circumstances as evidenced by per patient/family report.  GOAL:   Patient will meet greater than or equal to 90% of their needs  MONITOR:   PO intake, Supplement acceptance, Weight trends, Labs  REASON FOR ASSESSMENT:   Malnutrition Screening Tool  ASSESSMENT:   41 y.o. male, with history of hypertension, anxiety came to hospital with complaints of dizziness. Patient says that symptoms started 10 days ago when he received bad news that he will need to go to jail for 14 years. Since then patient says that he has not been able to sleep well, has not been eating much food. He has been taking his medications for hypertension including lisinopril. 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 Patient says that every time he got up from sitting position he felt dizzy but he denies passing out.   Pt seen for MST. BMI indicates underweight status. No intakes documented since admission. 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 Pt uses meth; he also drinks 3-4 beers several times per week and smokes 1/2 pack cigarettes per day. No abdominal pain or N/V now or PTA, simply did not have an appetite; suspect that this was stress-induced and exposure to heat at his job may have also contributed. He is currently ordered Ensure Enlive BID and has accepted 3/3 doses.   Unable to performed nutrition-focused physical assessment at this  time but will do so at follow-up later this week. Pt unsure of UBW and no weight hx available prior to this admission. Will monitor weight trends closely during hospitalization as pt may be dehydrated and noted current IVF/IVF rate. Suspect pt meets criteria for malnutrition but unsure of degree; will further document at time of follow-up.  Medications reviewed. Labs reviewed.   IVF: NS @ 100 mL/hr.    Diet Order:  Diet regular Room service appropriate? Yes; Fluid consistency: Thin Diet - low sodium heart healthy  Skin:  Reviewed, no issues  Last BM:  9/8  Height:   Ht Readings from Last 1 Encounters:  06/09/17 5\' 7"  (1.702 m)    Weight:   Wt Readings from Last 1 Encounters:  06/09/17 116 lb 6.5 oz (52.8 kg)    Ideal Body Weight:  67.27 kg  BMI:  Body mass index is 18.23 kg/m.  Estimated Nutritional Needs:   Kcal:  1585-1795 (30-34 kcal/kg)  Protein:  70-80 grams (~1.3-1.5 grams/kg)  Fluid:  >/= 1.8 L/day  EDUCATION NEEDS:   No education needs identified at this time    Trenton GammonJessica Django Nguyen, MS, RD, LDN, CNSC Inpatient Clinical Dietitian Pager # [phone] After hours/weekend pager # [phone]

## 2017-06-11 NOTE — Discharge Summary (Signed)
Physician Discharge Summary  Luis CossRobert Cortez WUJ:[id number] DOB: 01/17/1976 DOA: 06/09/2017  PCP: Dartha LodgeSteele, Anthony, FNP  Admit date: 06/09/2017 Discharge date: 06/11/2017  Admitted From: home Disposition:  home  Recommendations for Outpatient Follow-up:  1. Follow up with PCP in 1 week 2. Follow up with [facility] in 1 week regarding starting a different agent for depression and anxiety, other than Prozac.  3. Resume lisinopril as outpatient as directed by your PCP  4. Please obtain BMP 1 week   Discharge Condition: Stable CODE STATUS: Full  Diet recommendation: Heart healthy   Brief/Interim Summary: Luis CossRobert Cortez  is a 41 y.o. male, with history of hypertension, anxiety came to hospital with complaints of dizziness. Patient says that symptoms started 10 days ago when he received bad news that he will need to go to jail for 14 years. Since then patient says that he has not been able to sleep well, has not been eating much food. He has been taking his medications for hypertension including lisinopril. 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 Patient says that every time he got up from sitting position he felt dizzy but he denies passing out. In the emergency department, lab work revealed creatinine of 3.38. He was admitted for further treatment of his acute kidney injury. With supportive care, IV fluids, patient's creatinine improved to 1.03 on the of discharge. He had no further complaints. On day of discharge, he was feeling well, ready to go home. He denied any suicidal or homicidal ideation. He did admit to lot of stress and anxiety with his impending jail time. He was encouraged to follow up with [facility] who has been following him for depression and anxiety.  Discharge Diagnoses:  Principal Problem:   AKI (acute kidney injury) (HCC) Active Problems:   Dizziness   Essential hypertension   Depression   Anxiety  Discharge  Instructions  Discharge Instructions    Call MD for:  difficulty breathing, headache or visual disturbances    Complete by:  As directed    Call MD for:  extreme fatigue    Complete by:  As directed    Call MD for:  hives    Complete by:  As directed    Call MD for:  persistant dizziness or light-headedness    Complete by:  As directed    Call MD for:  persistant nausea and vomiting    Complete by:  As directed    Call MD for:  severe uncontrolled pain    Complete by:  As directed    Call MD for:  temperature >100.4    Complete by:  As directed    Diet - low sodium heart healthy    Complete by:  As directed    Discharge instructions    Complete by:  As directed    You were cared for by a hospitalist during your hospital stay. If you have any questions about your discharge medications or the care you received while you were in the hospital after you are discharged, you can call the unit and asked to speak with the hospitalist on call if the hospitalist that took care of you is not available. Once you are discharged, your primary care physician will handle any further medical issues. Please note that NO REFILLS for any discharge medications will be authorized once you are discharged, as it is imperative that you return to your primary care physician (or establish a  relationship with a primary care physician if you do not have one) for your aftercare needs so that they can reassess your need for medications and monitor your lab values.   Increase activity slowly    Complete by:  As directed      Allergies as of 06/11/2017   No Known Allergies     Medication List    STOP taking these medications   FLUoxetine 40 MG capsule Commonly known as:  PROZAC   lisinopril 20 MG tablet Commonly known as:  PRINIVIL,ZESTRIL   meloxicam 15 MG tablet Commonly known as:  MOBIC     TAKE these medications   hydrOXYzine 25 MG tablet Commonly known as:  ATARAX/VISTARIL Take 25 mg by mouth 3  (three) times daily as needed. Take 1 capsule in the morning and 2 in the evening   lamoTRIgine 100 MG tablet Commonly known as:  LAMICTAL Take 100 mg by mouth 2 (two) times daily.            Discharge Care Instructions        Start     Ordered   06/11/17 0000  Increase activity slowly     06/11/17 1006   06/11/17 0000  Diet - low sodium heart healthy     06/11/17 1006   06/11/17 0000  Discharge instructions    Comments:  You were cared for by a hospitalist during your hospital stay. If you have any questions about your discharge medications or the care you received while you were in the hospital after you are discharged, you can call the unit and asked to speak with the hospitalist on call if the hospitalist that took care of you is not available. Once you are discharged, your primary care physician will handle any further medical issues. Please note that NO REFILLS for any discharge medications will be authorized once you are discharged, as it is imperative that you return to your primary care physician (or establish a relationship with a primary care physician if you do not have one) for your aftercare needs so that they can reassess your need for medications and monitor your lab values.   06/11/17 1006   06/11/17 0000  Call MD for:  temperature >100.4     06/11/17 1006   06/11/17 0000  Call MD for:  persistant nausea and vomiting     06/11/17 1006   06/11/17 0000  Call MD for:  severe uncontrolled pain     06/11/17 1006   06/11/17 0000  Call MD for:  extreme fatigue     06/11/17 1006   06/11/17 0000  Call MD for:  persistant dizziness or light-headedness     06/11/17 1006   06/11/17 0000  Call MD for:  hives     06/11/17 1006   06/11/17 0000  Call MD for:  difficulty breathing, headache or visual disturbances     06/11/17 1006     Follow-up Information    Dartha Lodge, FNP. Schedule an appointment as soon as possible for a visit in 1 week(s).   Specialty:  Nurse  Practitioner Contact information: [address] [place] [address] [phone]        [facility]. Schedule an appointment as soon as possible for a visit in 1 week(s).   Contact information: [address], [place], [place] [address] [phone]         No Known Allergies  Consultations:  None    Procedures/Studies: US Renal  Result Date: 06/10/2017 CLINICAL DATA:  Acute kidney injury. EXAM: RENAL / URINARY TRACT ULTRASOUND COMPLETE COMPARISON:  None. FINDINGS: Right Kidney: Length: 9.9 cm. 4 mm calcification is noted in upper pole concerning for cortical calcification or nonobstructive nephrolithiasis. Echogenicity within normal limits. No mass or hydronephrosis visualized. Left Kidney: Length: 10.2 cm. Echogenicity within normal limits. No mass or hydronephrosis visualized. Bladder: Appears normal for degree of bladder distention. IMPRESSION: Possible cortical calcification or nonobstructive calculus seen in upper pole of right kidney. No other renal abnormality seen. Electronically Signed   By: Lupita Raider, M.D.   On: 06/10/2017 09:09    Echo Study Conclusions  - Left ventricle: The cavity size was normal. Wall thickness was   normal. Systolic function was normal. The estimated ejection   fraction was in the range of 60% to 65%. Wall motion was normal;   there were no regional wall motion abnormalities. Left   ventricular diastolic function parameters were normal.     Discharge Exam: Vitals:   06/10/17 1400 06/11/17 0456  BP: 134/87 127/76  Pulse: 91 82  Resp: 18 18  Temp: 98.3 F (36.8 C) 97.7 F (36.5 C)  SpO2: 100% 98%   Vitals:   06/10/17 0442 06/10/17 0500 06/10/17 1400 06/11/17 0456  BP: (!) 145/94  134/87 127/76  Pulse: (!) 114  91 82  Resp: Temp: 98.1 F (36.7 C)  98.3 F (36.8 C) 97.7 F (36.5 C)  TempSrc: Oral  Oral Oral  SpO2: (!) 80% 100% 100% 98%  Weight:      Height:        General: Pt is alert,  awake, not in acute distress Cardiovascular: RRR, S1/S2 +, no rubs, no gallops Respiratory: CTA bilaterally, no wheezing, no rhonchi Abdominal: Soft, NT, ND, bowel sounds + Extremities: no edema, no cyanosis Psych: pressured speech, but normal affect, denies suicidal or homicidal ideation    The results of significant diagnostics from this hospitalization (including imaging, microbiology, ancillary and laboratory) are listed below for reference.     Microbiology: Recent Results (from the past 240 hour(s))  Urine culture     Status: Abnormal   Collection Time: 06/09/17  4:55 PM  Result Value Ref Range Status   Specimen Description URINE, RANDOM  Final   Special Requests NONE  Final   Culture MULTIPLE SPECIES PRESENT, SUGGEST RECOLLECTION (A)  Final   Report Status 06/11/2017 FINAL  Final     Labs: BNP (last 3 results) No results for input(s): BNP in the last 8760 hours. Basic Metabolic Panel:  Recent Labs Lab 06/09/17 1554 06/10/17 0137 06/10/17 2019 06/11/17 0454  NA 137 142 144 141  K 4.5 5.2* 4.7 4.1  CL 101 107 108 106  CO2 GLUCOSE 112* 75 91 90  BUN 35* 28* 21* 19  CREATININE 3.38* 1.71* 1.10 1.03  CALCIUM 9.7 8.9 9.2 8.9   Liver Function Tests:  Recent Labs Lab 06/09/17 1554 06/10/17 0137  AST 17 18  ALT 19 16*  ALKPHOS 71 58  BILITOT 1.1 0.9  PROT 8.3* 6.5  ALBUMIN 4.8 3.9   No results for input(s): LIPASE, AMYLASE in the last 168 hours. No results for input(s): AMMONIA in the last 168 hours. CBC:  Recent Labs Lab 06/09/17 1554 06/10/17 0137  WBC 16.8* 9.8  HGB 16.2 12.9*  HCT 45.7 37.5*  MCV 90.3 91.2  PLT 326 274   Cardiac Enzymes:  Recent Labs Lab 06/09/17  1957 06/10/17 0137 06/10/17 0709  TROPONINI <0.03 <0.03 <0.03   BNP: Invalid input(s): POCBNP CBG: No results for input(s): GLUCAP in the last 168 hours. D-Dimer No results for input(s): DDIMER in the last 72 hours. Hgb A1c No results for input(s): HGBA1C in  the last 72 hours. Lipid Profile No results for input(s): CHOL, HDL, LDLCALC, TRIG, CHOLHDL, LDLDIRECT in the last 72 hours. Thyroid function studies No results for input(s): TSH, T4TOTAL, T3FREE, THYROIDAB in the last 72 hours.  Invalid input(s): FREET3 Anemia work up No results for input(s): VITAMINB12, FOLATE, FERRITIN, TIBC, IRON, RETICCTPCT in the last 72 hours. Urinalysis    Component Value Date/Time   COLORURINE AMBER (A) 06/09/2017 1515   APPEARANCEUR CLOUDY (A) 06/09/2017 1515   LABSPEC 1.020 06/09/2017 1515   PHURINE 5.0 06/09/2017 1515   GLUCOSEU NEGATIVE 06/09/2017 1515   HGBUR NEGATIVE 06/09/2017 1515   BILIRUBINUR SMALL (A) 06/09/2017 1515   KETONESUR 5 (A) 06/09/2017 1515   PROTEINUR >=300 (A) 06/09/2017 1515   NITRITE NEGATIVE 06/09/2017 1515   LEUKOCYTESUR SMALL (A) 06/09/2017 1515   Sepsis Labs Invalid input(s): PROCALCITONIN,  WBC,  LACTICIDVEN Microbiology Recent Results (from the past 240 hour(s))  Urine culture     Status: Abnormal   Collection Time: 06/09/17  4:55 PM  Result Value Ref Range Status   Specimen Description URINE, RANDOM  Final   Special Requests NONE  Final   Culture MULTIPLE SPECIES PRESENT, SUGGEST RECOLLECTION (A)  Final   Report Status 06/11/2017 FINAL  Final     Time coordinating discharge: 40 minutes  SIGNED:  Noralee Stain, DO [facility] Pager [phone]  If 7PM-7AM, please contact night-coverage [email or web address] Password [email or web address] 06/11/2017, 3:03 PM

## 2017-06-11 NOTE — Care Management Note (Signed)
Case Management Note  Patient Details  Name: Luis CossRobert Dreyfuss MRN: [id number] Date of Birth: 09/13/1976  Subjective/Objective: 41 y/o m admitted w/AKI. Has pcp. From home. No CM needs.                   Action/Plan:d/c home.   Expected Discharge Date:  06/11/17               Expected Discharge Plan:  Home/Self Care  In-House Referral:     Discharge planning Services  CM Consult  Post Acute Care Choice:    Choice offered to:     DME Arranged:    DME Agency:     HH Arranged:    HH Agency:     Status of Service:  Completed, signed off  If discussed at [employer] Length of Stay Meetings, dates discussed:    Additional Comments:  Lanier ClamMahabir, Ahmet Schank, RN 06/11/2017, 10:37 AM

## 2017-06-11 NOTE — Discharge Instructions (Signed)
Acute Kidney Injury, Adult Acute kidney injury is a sudden worsening of kidney function. The kidneys are organs that have several jobs. They filter the blood to remove waste products and extra fluid. They also maintain a healthy balance of minerals and hormones in the body, which helps control blood pressure and keep bones strong. With this condition, your kidneys do not do their jobs as well as they should. This condition ranges from mild to severe. Over time it may develop into long-lasting (chronic) kidney disease. Early detection and treatment may prevent acute kidney injury from developing into a chronic condition. What are the causes? Common causes of this condition include:  A problem with blood flow to the kidneys. This may be caused by:  Low blood pressure (hypotension) or shock.  Blood loss.  Heart and blood vessel (cardiovascular) disease.  Severe burns.  Liver disease.  Direct damage to the kidneys. This may be caused by:  Certain medicines.  A kidney infection.  Poisoning.  Being around or in contact with toxic substances.  A surgical wound.  A hard, direct hit to the kidney area.  A sudden blockage of urine flow. This may be caused by:  Cancer.  Kidney stones.  An enlarged prostate in males. What are the signs or symptoms? Symptoms of this condition may not be obvious until the condition becomes severe. Symptoms of this condition can include:  Tiredness (lethargy), or difficulty staying awake.  Nausea or vomiting.  Swelling (edema) of the face, legs, ankles, or feet.  Problems with urination, such as:  Abdominal pain, or pain along the side of your stomach (flank).  Decreased urine production.  Decrease in the force of urine flow.  Muscle twitches and cramps, especially in the legs.  Confusion or trouble concentrating.  Loss of appetite.  Fever. How is this diagnosed? This condition may be diagnosed with tests, including:  Blood  tests.  Urine tests.  Imaging tests.  A test in which a sample of tissue is removed from the kidneys to be examined under a microscope (kidney biopsy). How is this treated? Treatment for this condition depends on the cause and how severe the condition is. In mild cases, treatment may not be needed. The kidneys may heal on their own. In more severe cases, treatment will involve:  Treating the cause of the kidney injury. This may involve changing any medicines you are taking or adjusting your dosage.  Fluids. You may need specialized IV fluids to balance your body's needs.  Having a catheter placed to drain urine and prevent blockages.  Preventing problems from occurring. This may mean avoiding certain medicines or procedures that can cause further injury to the kidneys. In some cases treatment may also require:  A procedure to remove toxic wastes from the body (dialysis or continuous renal replacement therapy - CRRT).  Surgery. This may be done to repair a torn kidney, or to remove the blockage from the urinary system. Follow these instructions at home: Medicines   Take over-the-counter and prescription medicines only as told by your health care provider.  Do not take any new medicines without your health care provider's approval. Many medicines can worsen your kidney damage.  Do not take any vitamin and mineral supplements without your health care provider's approval. Many nutritional supplements can worsen your kidney damage. Lifestyle   If your health care provider prescribed changes to your diet, follow them. You may need to decrease the amount of protein you eat.  Achieve and maintain a   healthy weight. If you need help with this, ask your health care provider.  Start or continue an exercise plan. Try to exercise at least 30 minutes a day, 5 days a week.  Do not use any tobacco products, such as cigarettes, chewing tobacco, and e-cigarettes. If you need help quitting, ask  your health care provider. General instructions   Keep track of your blood pressure. Report changes in your blood pressure as told by your health care provider.  Stay up to date with immunizations. Ask your health care provider which immunizations you need.  Keep all follow-up visits as told by your health care provider. This is important. Where to find more information:  American Association of Kidney Patients: www.aakp.org  National Kidney Foundation: www.kidney.org  American Kidney Fund: www.akfinc.org  Life Options Rehabilitation Program:  www.lifeoptions.org  www.kidneyschool.org Contact a health care provider if:  Your symptoms get worse.  You develop new symptoms. Get help right away if:  You develop symptoms of worsening kidney disease, which include:  Headaches.  Abnormally dark or light skin.  Easy bruising.  Frequent hiccups.  Chest pain.  Shortness of breath.  End of menstruation in women.  Seizures.  Confusion or altered mental status.  Abdominal or back pain.  Itchiness.  You have a fever.  Your body is producing less urine.  You have pain or bleeding when you urinate. Summary  Acute kidney injury is a sudden worsening of kidney function.  Acute kidney injury can be caused by problems with blood flow to the kidneys, direct damage to the kidneys, and sudden blockage of urine flow.  Symptoms of this condition may not be obvious until it becomes severe. Symptoms may include edema, lethargy, confusion, nausea or vomiting, and problems passing urine.  This condition can usually be diagnosed with blood tests, urine tests, and imaging tests. Sometimes a kidney biopsy is done to diagnose this condition.  Treatment for this condition often involves treating the underlying cause. It is treated with fluids, medicines, dialysis, diet changes, or surgery. This information is not intended to replace advice given to you by your health care provider.  Make sure you discuss any questions you have with your health care provider. Document Released: 04/03/2011 Document Revised: 09/08/2016 Document Reviewed: 09/08/2016 [employer]vier Interactive Patient Education  2017 [employer]  

## 2017-10-26 ENCOUNTER — Encounter (HOSPITAL_COMMUNITY): Payer: Self-pay | Admitting: Emergency Medicine

## 2017-10-26 ENCOUNTER — Emergency Department (HOSPITAL_COMMUNITY)
Admission: EM | Admit: 2017-10-26 | Discharge: 2017-10-26 | Disposition: A | Discharge status: Home / Self Care | Payer: Self-pay | Attending: Emergency Medicine | Admitting: Emergency Medicine

## 2017-10-26 DIAGNOSIS — F17210 Nicotine dependence, cigarettes, uncomplicated: Secondary | ICD-10-CM | POA: Insufficient documentation

## 2017-10-26 DIAGNOSIS — Z760 Encounter for issue of repeat prescription: Secondary | ICD-10-CM | POA: Insufficient documentation

## 2017-10-26 DIAGNOSIS — F419 Anxiety disorder, unspecified: Secondary | ICD-10-CM | POA: Insufficient documentation

## 2017-10-26 DIAGNOSIS — Z79899 Other long term (current) drug therapy: Secondary | ICD-10-CM | POA: Insufficient documentation

## 2017-10-26 DIAGNOSIS — I10 Essential (primary) hypertension: Secondary | ICD-10-CM | POA: Insufficient documentation

## 2017-10-26 HISTORY — DX: Depression, unspecified: F32.A

## 2017-10-26 HISTORY — DX: Essential (primary) hypertension: I10

## 2017-10-26 HISTORY — DX: Anxiety disorder, unspecified: F41.9

## 2017-10-26 HISTORY — DX: Major depressive disorder, single episode, unspecified: F32.9

## 2017-10-26 LAB — BASIC METABOLIC PANEL
Anion gap: 9 (ref 5–15)
BUN: 18 mg/dL (ref 6–20)
CHLORIDE: 103 mmol/L (ref 101–111)
CO2: 26 mmol/L (ref 22–32)
Calcium: 10.4 mg/dL — ABNORMAL HIGH (ref 8.9–10.3)
Creatinine, Ser: 1.03 mg/dL (ref 0.61–1.24)
GFR calc Af Amer: >60 mL/min (ref >60)
GFR calc non Af Amer: >60 mL/min (ref >60)
Glucose, Bld: 98 mg/dL (ref 65–99)
POTASSIUM: 3.7 mmol/L (ref 3.5–5.1)
SODIUM: 138 mmol/L (ref 135–145)

## 2017-10-26 LAB — CBC
HCT: 47.6 % (ref 39.0–52.0)
Hemoglobin: 16.8 g/dL (ref 13.0–17.0)
MCH: 31.6 pg (ref 26.0–34.0)
MCHC: 35.3 g/dL (ref 30.0–36.0)
MCV: 89.5 fL (ref 78.0–100.0)
Platelets: 343 10*3/uL (ref 150–400)
RBC: 5.32 MIL/uL (ref 4.22–5.81)
RDW: 12.9 % (ref 11.5–15.5)
WBC: 10.9 10*3/uL — AB (ref 4.0–10.5)

## 2017-10-26 LAB — CBG MONITORING, ED: Glucose-Capillary: 91 mg/dL (ref 65–99)

## 2017-10-26 MED ORDER — ATENOLOL 50 MG PO TABS: 50.0000 mg | ORAL_TABLET | Freq: Every day | ORAL | 0 refills | Status: AC

## 2017-10-26 MED ORDER — LAMOTRIGINE 100 MG PO TABS: 100.0000 mg | ORAL_TABLET | Freq: Two times a day (BID) | ORAL | 0 refills | Status: AC

## 2017-10-26 MED ORDER — QUETIAPINE FUMARATE 50 MG PO TABS: 75.0000 mg | ORAL_TABLET | Freq: Every day | ORAL | 0 refills | Status: AC

## 2017-10-26 MED ORDER — HYDROXYZINE HCL 25 MG PO TABS: 25.0000 mg | ORAL_TABLET | Freq: Three times a day (TID) | ORAL | 0 refills | Status: AC | PRN

## 2017-10-26 NOTE — ED Provider Notes (Signed)
[facility]-EMERGENCY DEPT Provider Note   CSN: [id number] Arrival date & time: 10/26/17  1427     History   Chief Complaint Chief Complaint  Patient presents with  . out of medications  . near syncopoe    HPI Luis Cortez is a 42 y.o. male.  Pt presents to the ED today because he's been out of his meds for the past few weeks.  Pt said he lost his medicaid and has been unable to see his doctor.  Pt denies any pain.  He said his bp has been fluctuating.  Pt's HR is elevated, but he said it is always elevated when he runs out of his meds.      Past Medical History:  Diagnosis Date  . Anxiety   . Depression   . Hypertension     Patient Active Problem List   Diagnosis Date Noted  . Dizziness 06/11/2017  . Essential hypertension 06/11/2017  . Depression 06/11/2017  . Anxiety 06/11/2017  . AKI (acute kidney injury) (HCC) 06/09/2017    History reviewed. No pertinent surgical history.     Home Medications    Prior to Admission medications   Medication Sig Start Date End Date Taking? Authorizing Provider  atenolol (TENORMIN) 50 MG tablet Take 1 tablet (50 mg total) by mouth daily. 10/26/17   Jacalyn Lefevre, MD  hydrOXYzine (ATARAX/VISTARIL) 25 MG tablet Take 1 tablet (25 mg total) by mouth 3 (three) times daily as needed. Take 1 capsule in the morning and 2 in the evening 10/26/17   Jacalyn Lefevre, MD  lamoTRIgine (LAMICTAL) 100 MG tablet Take 1-2 tablets (100-200 mg total) by mouth 2 (two) times daily. 10/26/17   Jacalyn Lefevre, MD  QUEtiapine (SEROQUEL) 50 MG tablet Take 1.5 tablets (75 mg total) by mouth at bedtime. 10/26/17   Jacalyn Lefevre, MD    Family History No family history on file.  Social History Social History   Tobacco Use  . Smoking status: Current Every Day Smoker    Packs/day: 1.00    Types: Cigarettes  . Smokeless tobacco: Never Used  Substance Use Topics  . Alcohol use: Yes    Comment: socially  . Drug use: No      Allergies   Patient has no known allergies.   Review of Systems Review of Systems  Psychiatric/Behavioral: The patient is nervous/anxious.   All other systems reviewed and are negative.    Physical Exam Updated Vital Signs BP (!) 177/115 (BP Location: Right Arm)   Pulse (!) 119   Temp 98.3 F (36.8 C) (Oral)   Resp 20   Ht 5\' 7"  (1.702 m)   Wt 59 kg (130 lb)   SpO2 96%   BMI 20.36 kg/m   Physical Exam  Constitutional: He is oriented to person, place, and time. He appears well-developed and well-nourished.  HENT:  Head: Normocephalic and atraumatic.  Right Ear: External ear normal.  Left Ear: External ear normal.  Nose: Nose normal.  Mouth/Throat: Oropharynx is clear and moist.  Eyes: Conjunctivae and EOM are normal. Pupils are equal, round, and reactive to light.  Neck: Normal range of motion. Neck supple.  Cardiovascular: Regular rhythm, normal heart sounds and intact distal pulses. Tachycardia present.  Pulmonary/Chest: Effort normal and breath sounds normal.  Abdominal: Soft. Bowel sounds are normal.  Musculoskeletal: Normal range of motion.  Neurological: He is alert and oriented to person, place, and time.  Skin: Skin is warm and dry. Capillary refill takes less than 2  seconds.  Psychiatric: His behavior is normal. Judgment and thought content normal. His mood appears anxious.  Nursing note and vitals reviewed.    ED Treatments / Results  Labs (all labs ordered are listed, but only abnormal results are displayed) Labs Reviewed  BASIC METABOLIC PANEL - Abnormal; Notable for the following components:      Result Value   Calcium 10.4 (*)    All other components within normal limits  CBC - Abnormal; Notable for the following components:   WBC 10.9 (*)    All other components within normal limits  URINALYSIS, ROUTINE W REFLEX MICROSCOPIC  CBG MONITORING, ED    EKG  EKG Interpretation None       Radiology No results  found.  Procedures Procedures (including critical care time)  Medications Ordered in ED Medications - No data to display   Initial Impression / Assessment and Plan / ED Course  I have reviewed the triage vital signs and the nursing notes.  Pertinent labs & imaging results that were available during my care of the patient were reviewed by me and considered in my medical decision making (see chart for details).  Pt will be given 1 month refill on his meds.  He is encouraged to establish primary care.  He knows to return if worse.  Labs checked and ok.  Final Clinical Impressions(s) / ED Diagnoses   Final diagnoses:  Anxiety  Medication refill    ED Discharge Orders        Ordered    atenolol (TENORMIN) 50 MG tablet  Daily     10/26/17 1623    hydrOXYzine (ATARAX/VISTARIL) 25 MG tablet  3 times daily PRN     10/26/17 1623    lamoTRIgine (LAMICTAL) 100 MG tablet  2 times daily     10/26/17 1623    QUEtiapine (SEROQUEL) 50 MG tablet  Daily at bedtime     10/26/17 1623       Jacalyn LefevreHaviland, Cris Gibby, MD 10/26/17 1626

## 2017-10-26 NOTE — ED Triage Notes (Signed)
Patient c/o being out of HTN, anxiety , depression medications for 3 weeks. Reports BP been fluctuating up and down.  States that been around grandson who has Flu.

## 2018-05-20 IMAGING — US US RENAL
1 series · 14 of 25 positions shown · non-contrast
Comparison: None.

CLINICAL DATA: Acute kidney injury.

EXAM:
RENAL / URINARY TRACT ULTRASOUND COMPLETE

[Series 1: us renal · 0.22mm/px · 14 of 42 slices shown]
[im 1/42]
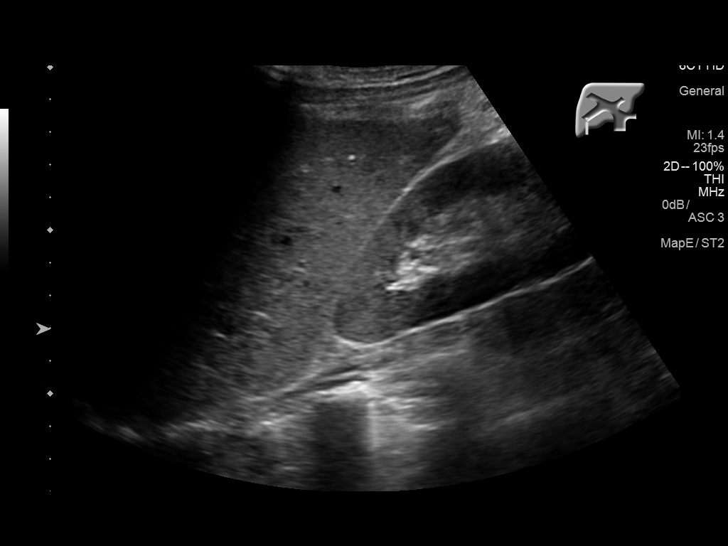
[im 4/42]
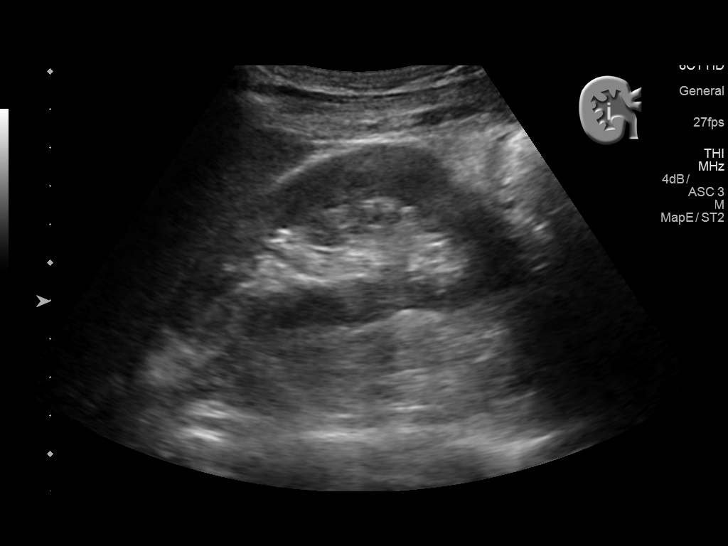
[im 7/42]
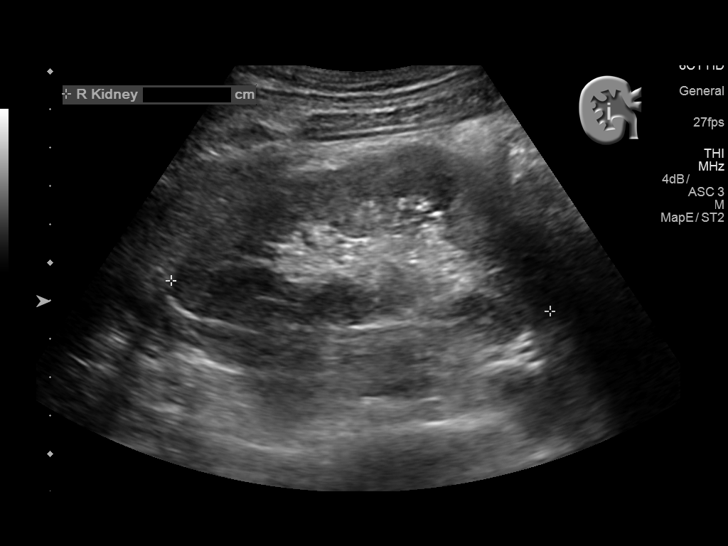
[im 11/42]
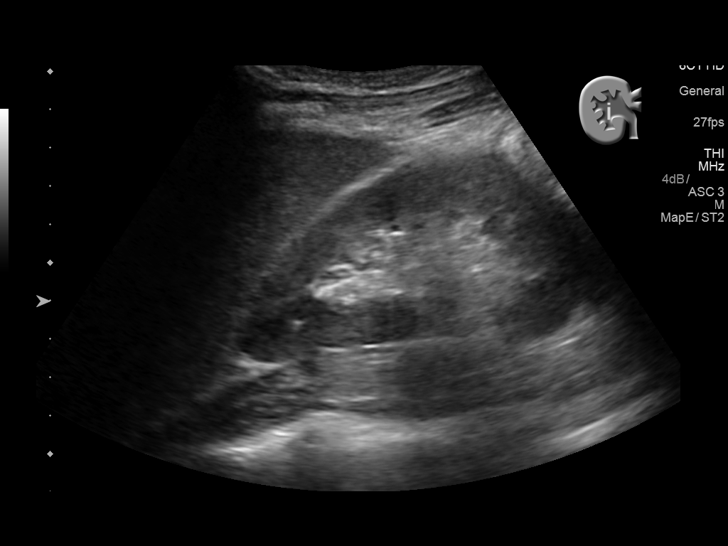
[im 14/42]
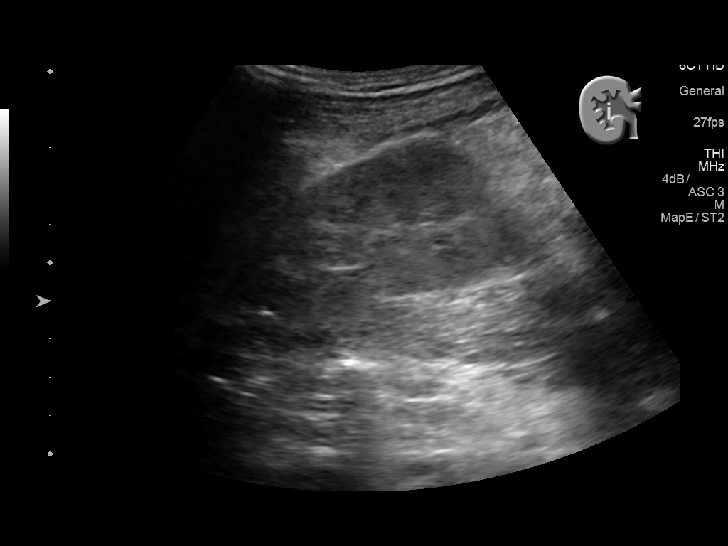
[im 16/42]
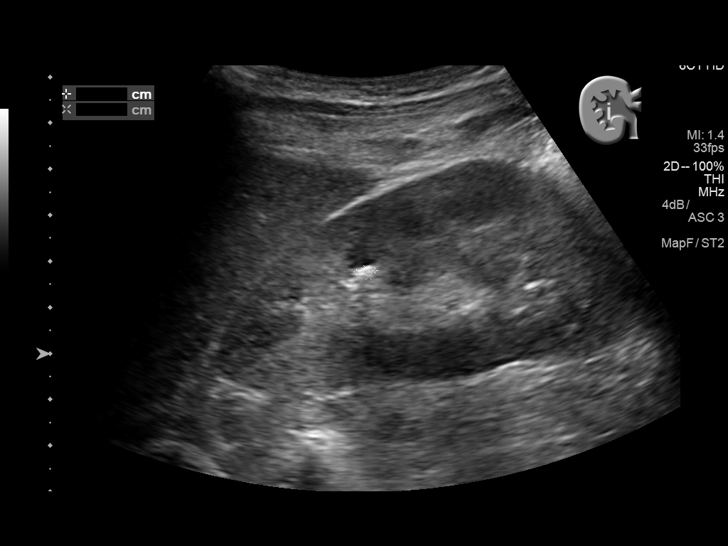
[im 19/42]
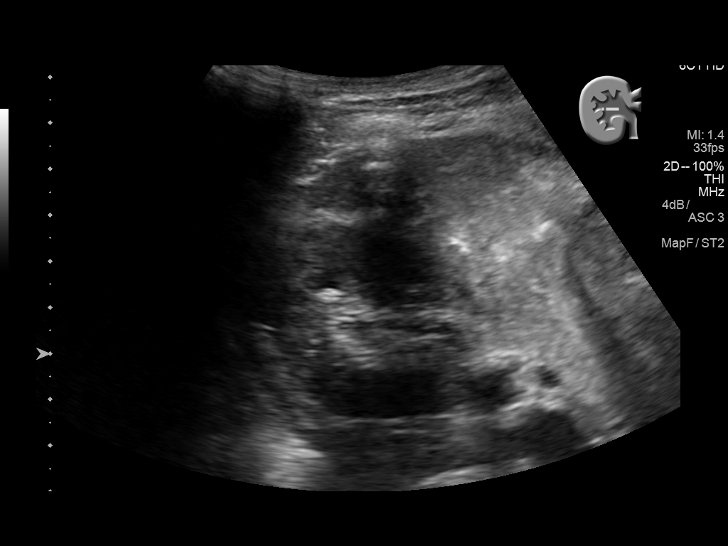
[im 23/42]
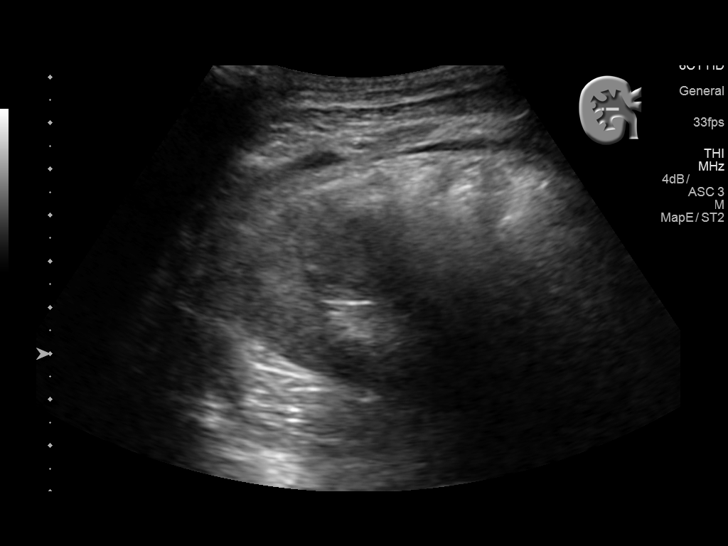
[im 26/42]
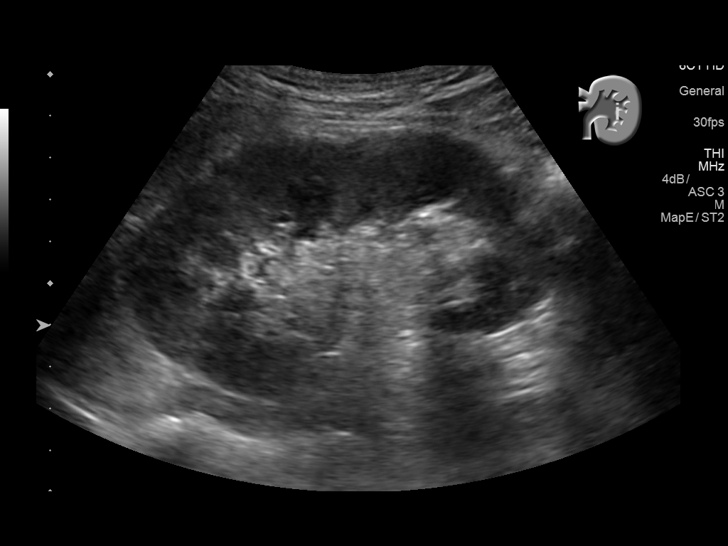
[im 28/42]
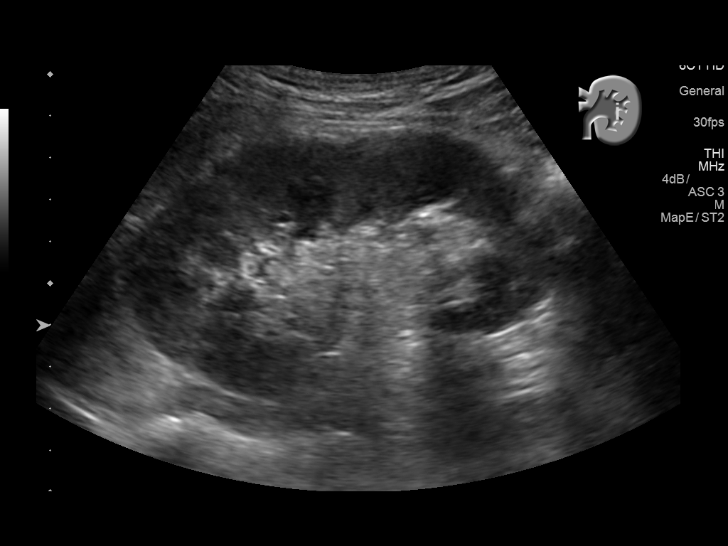
[im 31/42]
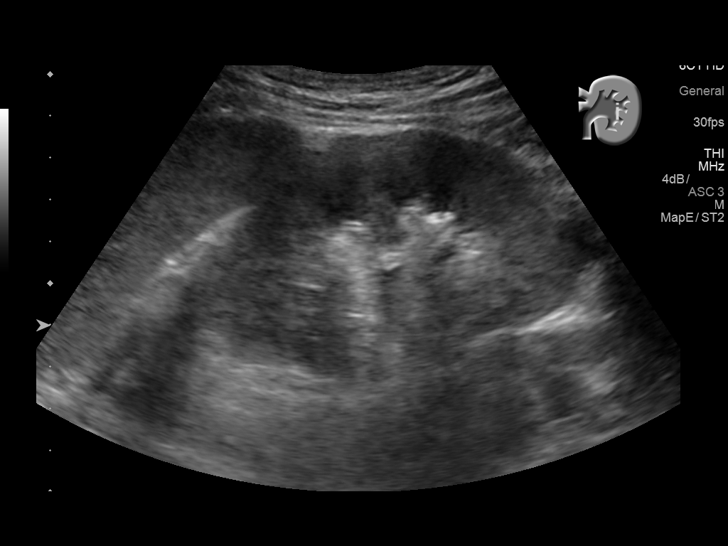
[im 35/42]
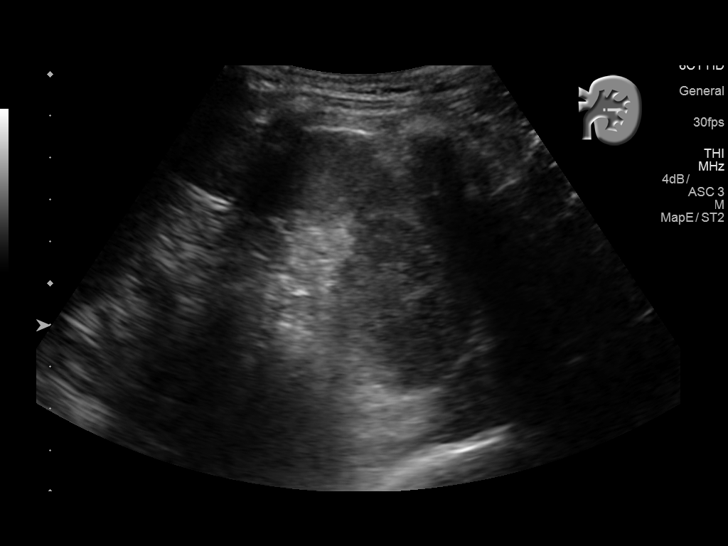
[im 38/42]
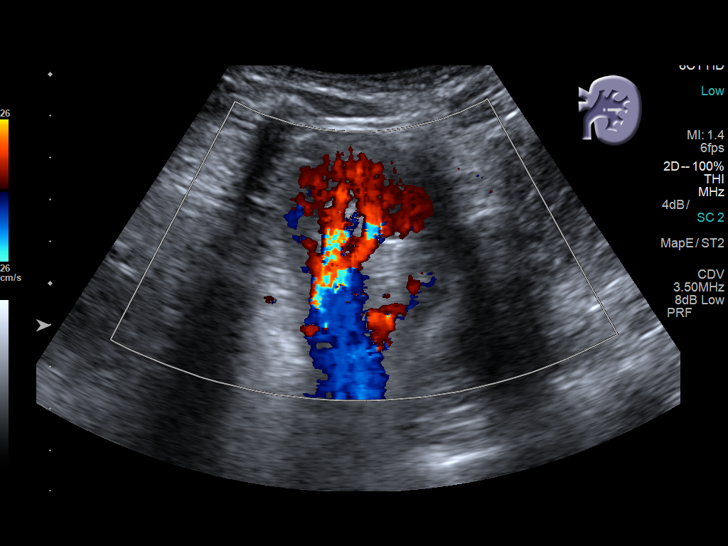
[im 42/42]
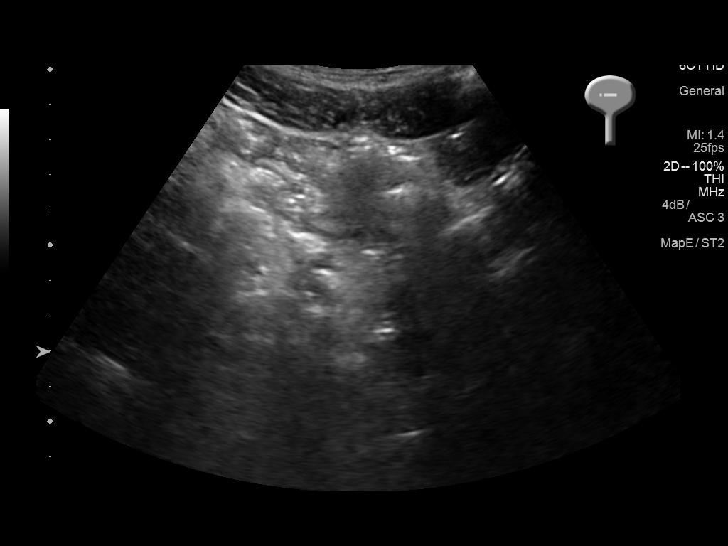

[14 of 25 positions shown; findings below may reference images not displayed]

FINDINGS: Right Kidney:

Length: 9.9 cm. 4 mm calcification is noted in upper pole concerning
for cortical calcification or nonobstructive nephrolithiasis.
Echogenicity within normal limits. No mass or hydronephrosis
visualized.

Left Kidney:

Length: 10.2 cm. Echogenicity within normal limits. No mass or
hydronephrosis visualized.

Bladder:

Appears normal for degree of bladder distention.
IMPRESSION: Possible cortical calcification or nonobstructive calculus seen in
upper pole of right kidney. No other renal abnormality seen.
